# Patient Record
Sex: Female | Born: 2000 | Race: White | Hispanic: No | Marital: Single | Smoking: Never smoker
Health system: Southern US, Community
[De-identification: ages and names within clinical notes are randomized; demographics above are authoritative.]

## PROBLEM LIST (undated history)

## (undated) HISTORY — PX: NOSE SURGERY: SHX723

---
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
## 2021-04-06 ENCOUNTER — Other Ambulatory Visit: Payer: Self-pay

## 2021-04-06 ENCOUNTER — Emergency Department (INDEPENDENT_AMBULATORY_CARE_PROVIDER_SITE_OTHER)
Admission: EM | Admit: 2021-04-06 | Discharge: 2021-04-06 | Disposition: A | Discharge status: Home / Self Care | Payer: 59 | Source: Home / Self Care

## 2021-04-06 ENCOUNTER — Encounter: Payer: Self-pay | Admitting: Emergency Medicine

## 2021-04-06 DIAGNOSIS — J101 Influenza due to other identified influenza virus with other respiratory manifestations: Secondary | ICD-10-CM

## 2021-04-06 DIAGNOSIS — R509 Fever, unspecified: Secondary | ICD-10-CM

## 2021-04-06 LAB — POCT INFLUENZA A/B
Influenza A, POC: POSITIVE — AB
Influenza B, POC: NEGATIVE

## 2021-04-06 MED ORDER — OSELTAMIVIR PHOSPHATE 75 MG PO CAPS: 75.0000 mg | ORAL_CAPSULE | Freq: Two times a day (BID) | ORAL | 0 refills | Status: DC

## 2021-04-06 MED ORDER — ACETAMINOPHEN 500 MG PO TABS
1000.0000 mg | ORAL_TABLET | ORAL | Status: AC
  Administered 2021-04-06: 1000 mg via ORAL

## 2021-04-06 NOTE — ED Provider Notes (Signed)
[facility]    CSN: [id number] Arrival date & time: 04/06/21  1956      History   Chief Complaint Chief Complaint  Patient presents with   Chills   Fever    HPI Meghan Morris is a 20 y.o. female.   HPI 20 year old female presents with fever, body aches, cough, chills and congestion for days.  Patient is accompanied by her Mother this evening.  History reviewed. No pertinent past medical history.  There are no problems to display for this patient.   Past Surgical History:  Procedure Laterality Date   NOSE SURGERY      OB History   No obstetric history on file.      Home Medications    Prior to Admission medications   Medication Sig Start Date End Date Taking? Authorizing Provider  oseltamivir (TAMIFLU) 75 MG capsule Take 1 capsule (75 mg total) by mouth every 12 (twelve) hours. 04/06/21  Yes Trevor Iha, FNP  amoxicillin (AMOXIL) 500 MG capsule Take 1 capsule (500 mg total) by mouth 3 (three) times daily. Patient not taking: Reported on 04/06/2021 12/02/18   Lurene Shadow, PA-C  ibuprofen (ADVIL,MOTRIN) 400 MG tablet Take 1 tablet (400 mg total) by mouth every 6 (six) hours as needed (For Pain). 05/28/14   Lajean Manes, MD  mupirocin ointment Idelle Jo) 2 % Apply to wound 3 times daily for 5 days Patient not taking: Reported on 04/06/2021 12/02/18   Lurene Shadow, PA-C  sulfamethoxazole-trimethoprim (BACTRIM) 400-80 MG per tablet Take 1 tablet by mouth 2 (two) times daily. (Antibiotic) Patient not taking: Reported on 04/06/2021 05/28/14   Lajean Manes, MD    Family History Family History  Problem Relation Age of Onset   Healthy Mother     Social History Social History   Tobacco Use   Smoking status: Never    Passive exposure: Never   Smokeless tobacco: Never  Vaping Use   Vaping Use: Never used  Substance Use Topics   Alcohol use: Not Currently   Drug use: Never     Allergies   Cefdinir   Review of Systems Review of Systems   Constitutional:  Positive for chills, fatigue and fever.  Respiratory:  Positive for choking.   Musculoskeletal:  Positive for myalgias.  All other systems reviewed and are negative.   Physical Exam Triage Vital Signs ED Triage Vitals  Enc Vitals Group     BP      Pulse      Resp      Temp      Temp src      SpO2      Weight      Height      Head Circumference      Peak Flow      Pain Score      Pain Loc      Pain Edu?      Excl. in GC?    No data found.  Updated Vital Signs BP 100/66 (BP Location: Left Arm)   Pulse 72   Temp (!) 103.1 F (39.5 C) (Oral)   Resp 18   SpO2 98%   Physical Exam Vitals and nursing note reviewed.  Constitutional:      Appearance: Normal appearance. She is normal weight.  HENT:     Head: Normocephalic and atraumatic.     Right Ear: Tympanic membrane, ear canal and external ear normal.     Left Ear: Tympanic membrane, ear canal and  external ear normal.     Mouth/Throat:     Mouth: Mucous membranes are moist.     Pharynx: Oropharynx is clear.  Eyes:     Extraocular Movements: Extraocular movements intact.     Conjunctiva/sclera: Conjunctivae normal.     Pupils: Pupils are equal, round, and reactive to light.  Cardiovascular:     Rate and Rhythm: Normal rate and regular rhythm.     Pulses: Normal pulses.     Heart sounds: Normal heart sounds.  Pulmonary:     Effort: Pulmonary effort is normal.     Breath sounds: Normal breath sounds.  Musculoskeletal:        General: Normal range of motion.     Cervical back: Normal range of motion and neck supple.  Skin:    General: Skin is warm and dry.  Neurological:     General: No focal deficit present.     Mental Status: She is alert and oriented to person, place, and time. Mental status is at baseline.     UC Treatments / Results  Labs (all labs ordered are listed, but only abnormal results are displayed) Labs Reviewed  POCT INFLUENZA A/B - Abnormal; Notable for the following  components:      Result Value   Influenza A, POC Positive (*)    All other components within normal limits    EKG   Radiology No results found.  Procedures Procedures (including critical care time)  Medications Ordered in UC Medications  acetaminophen (TYLENOL) tablet 1,000 mg (1,000 mg Oral Given 04/06/21 2020)    Initial Impression / Assessment and Plan / UC Course  I have reviewed the triage vital signs and the nursing notes.  Pertinent labs & imaging results that were available during my care of the patient were reviewed by me and considered in my medical decision making (see chart for details).     MDM: 1.  Fever-Advised Mother/patient may alternate between OTC Tylenol 1000 mg 1-2 times daily with OTC Ibuprofen 600 mg 1-2 times daily, as needed; 2.  Influenza A-Rx'd Tamiflu.  Advised mother/patient to take medication as directed with food to completion.  Patient discharged home, hemodynamically stable. Final Clinical Impressions(s) / UC Diagnoses   Final diagnoses:  Fever, unspecified  Influenza A     Discharge Instructions      Advised Mother/patient may alternate between OTC Tylenol 1000 mg 1-2 times daily with OTC Ibuprofen 600 mg 1-2 times daily, as needed.  Advised Mother/patient to take medication as directed with food to completion.     ED Prescriptions     Medication Sig Dispense Auth. Provider   oseltamivir (TAMIFLU) 75 MG capsule Take 1 capsule (75 mg total) by mouth every 12 (twelve) hours. 10 capsule Trevor Iha, FNP      PDMP not reviewed this encounter.   Trevor Iha, FNP 04/06/21 2032

## 2021-04-06 NOTE — ED Triage Notes (Signed)
Chills, fever since this am  103.1 temp in triage  Ibuprofen 400mg   this am & 200mg  at 3pm No tylenol today  No flu vaccien  Here w/ mom

## 2021-04-06 NOTE — Discharge Instructions (Addendum)
Advised Mother/patient may alternate between OTC Tylenol 1000 mg 1-2 times daily with OTC Ibuprofen 600 mg 1-2 times daily, as needed.  Advised Mother/patient to take medication as directed with food to completion.

## 2023-01-09 ENCOUNTER — Encounter: Payer: Self-pay | Admitting: Emergency Medicine

## 2023-01-09 ENCOUNTER — Ambulatory Visit
Admission: EM | Admit: 2023-01-09 | Discharge: 2023-01-09 | Disposition: A | Discharge status: Home / Self Care | Payer: BC Managed Care – PPO | Attending: Family Medicine | Admitting: Family Medicine

## 2023-01-09 DIAGNOSIS — S70361A Insect bite (nonvenomous), right thigh, initial encounter: Secondary | ICD-10-CM

## 2023-01-09 DIAGNOSIS — T7840XA Allergy, unspecified, initial encounter: Secondary | ICD-10-CM | POA: Diagnosis not present

## 2023-01-09 DIAGNOSIS — W57XXXA Bitten or stung by nonvenomous insect and other nonvenomous arthropods, initial encounter: Secondary | ICD-10-CM

## 2023-01-09 NOTE — Discharge Instructions (Signed)
Ice Daytime antihistamine at double dose Benadryl at night Call for problems

## 2023-01-09 NOTE — ED Triage Notes (Signed)
Patient c/o possible spider bite on the back of her right thigh x 3 days.  The area is red, round and getting larger.  Itchy at first, now not itchy.  Patient has applied some OTC itch cream.

## 2023-01-09 NOTE — ED Provider Notes (Signed)
[facility]    CSN: [id number] Arrival date & time: 01/09/23  1805      History   Chief Complaint Chief Complaint  Patient presents with   Insect Bite    HPI ALIANNAH Morris is a 22 y.o. female.   Patient is here for an insect bite to her right leg.  Its on the back of her thigh.  It is getting larger.  It does not itch.    History reviewed. No pertinent past medical history.  There are no problems to display for this patient.   Past Surgical History:  Procedure Laterality Date   NOSE SURGERY      OB History   No obstetric history on file.      Home Medications    Prior to Admission medications   Medication Sig Start Date End Date Taking? Authorizing Provider  ibuprofen (ADVIL,MOTRIN) 400 MG tablet Take 1 tablet (400 mg total) by mouth every 6 (six) hours as needed (For Pain). 05/28/14   Lajean Manes, MD    Family History Family History  Problem Relation Age of Onset   Healthy Mother     Social History Social History   Tobacco Use   Smoking status: Never    Passive exposure: Never   Smokeless tobacco: Never  Vaping Use   Vaping status: Never Used  Substance Use Topics   Alcohol use: Not Currently   Drug use: Never     Allergies   Cefdinir   Review of Systems Review of Systems  See HPI Physical Exam Triage Vital Signs ED Triage Vitals  Encounter Vitals Group     BP      Systolic BP Percentile      Diastolic BP Percentile      Pulse      Resp      Temp      Temp src      SpO2      Weight      Height      Head Circumference      Peak Flow      Pain Score      Pain Loc      Pain Education      Exclude from Growth Chart    No data found.  Updated Vital Signs BP 116/72 (BP Location: Left Arm)   Pulse 65   Temp 98.5 F (36.9 C) (Oral)   Resp 16   Ht 6' (1.829 m)   Wt 72.6 kg   LMP 01/03/2023   SpO2 99%   BMI 21.70 kg/m   Visual Acuity     Physical Exam Constitutional:      General: She is not in  acute distress.    Appearance: She is well-developed.  HENT:     Head: Normocephalic and atraumatic.  Eyes:     Conjunctiva/sclera: Conjunctivae normal.     Pupils: Pupils are equal, round, and reactive to light.  Cardiovascular:     Rate and Rhythm: Normal rate.  Pulmonary:     Effort: Pulmonary effort is normal. No respiratory distress.  Abdominal:     General: There is no distension.     Palpations: Abdomen is soft.  Musculoskeletal:        General: Normal range of motion.     Cervical back: Normal range of motion.  Skin:    General: Skin is warm and dry.     Findings: Rash present.     Comments: On the back of  the right thigh there is a largely oval patch of erythematous skin with a deeper red center.  It measures approximately 10 x 12 cm.  There is no induration or vesicles  Neurological:     Mental Status: She is alert.      UC Treatments / Results  Labs (all labs ordered are listed, but only abnormal results are displayed) Labs Reviewed - No data to display  EKG   Radiology No results found.  Procedures Procedures (including critical care time)  Medications Ordered in UC Medications - No data to display  Initial Impression / Assessment and Plan / UC Course  I have reviewed the triage vital signs and the nursing notes.  Pertinent labs & imaging results that were available during my care of the patient were reviewed by me and considered in my medical decision making (see chart for details).    Final Clinical Impressions(s) / UC Diagnoses   Final diagnoses:  Allergic reaction, initial encounter  Insect bite of right thigh, initial encounter     Discharge Instructions      Ice Daytime antihistamine at double dose Benadryl at night Call for problems     ED Prescriptions   None    PDMP not reviewed this encounter.   Eustace Moore, MD 01/09/23 1901
